# Patient Record
Sex: Male | Born: 1990 | Race: Black or African American | Hispanic: No | Marital: Single | Smoking: Never smoker
Health system: Southern US, Community
[De-identification: ages and names within clinical notes are randomized; demographics above are authoritative.]

## PROBLEM LIST (undated history)

## (undated) DIAGNOSIS — F988 Other specified behavioral and emotional disorders with onset usually occurring in childhood and adolescence: Secondary | ICD-10-CM

## (undated) DIAGNOSIS — F329 Major depressive disorder, single episode, unspecified: Secondary | ICD-10-CM

## (undated) DIAGNOSIS — F32A Depression, unspecified: Secondary | ICD-10-CM

## (undated) DIAGNOSIS — J302 Other seasonal allergic rhinitis: Secondary | ICD-10-CM

---

## 2013-10-01 ENCOUNTER — Encounter (HOSPITAL_COMMUNITY): Payer: Self-pay | Admitting: Emergency Medicine

## 2013-10-01 ENCOUNTER — Emergency Department (HOSPITAL_COMMUNITY)
Admission: EM | Admit: 2013-10-01 | Discharge: 2013-10-01 | Disposition: A | Discharge status: Home / Self Care | Payer: Managed Care, Other (non HMO) | Source: Home / Self Care | Attending: Family Medicine | Admitting: Family Medicine

## 2013-10-01 ENCOUNTER — Emergency Department (INDEPENDENT_AMBULATORY_CARE_PROVIDER_SITE_OTHER): Payer: Managed Care, Other (non HMO)

## 2013-10-01 DIAGNOSIS — Y9302 Activity, running: Secondary | ICD-10-CM

## 2013-10-01 DIAGNOSIS — S93609A Unspecified sprain of unspecified foot, initial encounter: Secondary | ICD-10-CM

## 2013-10-01 DIAGNOSIS — S93601A Unspecified sprain of right foot, initial encounter: Secondary | ICD-10-CM

## 2013-10-01 HISTORY — DX: Depression, unspecified: F32.A

## 2013-10-01 HISTORY — DX: Other specified behavioral and emotional disorders with onset usually occurring in childhood and adolescence: F98.8

## 2013-10-01 HISTORY — DX: Other seasonal allergic rhinitis: J30.2

## 2013-10-01 HISTORY — DX: Major depressive disorder, single episode, unspecified: F32.9

## 2013-10-01 NOTE — ED Notes (Signed)
Pt c/o right foot pain/plantar onset yest Reports he was running on a treadmill on Thursday Pain increases when bearing wt Alert w/no signs of acute distress.

## 2013-10-01 NOTE — ED Provider Notes (Signed)
CSN: [id number]     Arrival date & time 10/01/13  1706 History   First MD Initiated Contact with Patient 10/01/13 1724     Chief Complaint  Patient presents with  . Foot Pain   (Consider location/radiation/quality/duration/timing/severity/associated sxs/prior Treatment) Patient is a 23 y.o. male presenting with lower extremity pain. The history is provided by the patient.  Foot Pain This is a new problem. The current episode started 2 days ago (running on thurs, NKI, onset of pain on fri.). The problem has been gradually worsening. The symptoms are aggravated by walking.    Past Medical History  Diagnosis Date  . ADD (attention deficit disorder)   . Depression   . Seasonal allergies    History reviewed. No pertinent past surgical history. No family history on file. History  Substance Use Topics  . Smoking status: Never Smoker   . Smokeless tobacco: Not on file  . Alcohol Use: No    Review of Systems  Constitutional: Negative.   Gastrointestinal: Negative.   Genitourinary: Negative.   Musculoskeletal: Positive for gait problem. Negative for joint swelling.  Skin: Negative.     Allergies  Soy allergy  Home Medications   Prior to Admission medications   Not on File   BP 139/84  Pulse 66  Temp(Src) 97.9 F (36.6 C) (Oral)  Resp 14  SpO2 99% Physical Exam  Nursing note and vitals reviewed. Constitutional: He is oriented to person, place, and time. He appears well-developed and well-nourished. No distress.  Musculoskeletal: He exhibits tenderness.       Feet:  Neurological: He is alert and oriented to person, place, and time.  Skin: Skin is warm and dry.    ED Course  Procedures (including critical care time) Labs Review Labs Reviewed - No data to display  Imaging Review Dg Foot Complete Right  10/01/2013   CLINICAL DATA:  Lateral right foot pain for 2 days after running.  EXAM: RIGHT FOOT COMPLETE - 3+ VIEW  COMPARISON:  None.  FINDINGS: Imaged bones,  joints and soft tissues appear normal.  IMPRESSION: Negative exam.   Electronically Signed   By: Drusilla Kannerhomas  Dalessio M.D.   On: 10/01/2013 17:59    X-rays reviewed and report per radiologist.  MDM   1. Sprain of foot, right        Linna HoffJames D Azari Hasler, MD 10/01/13 [phone]

## 2013-10-01 NOTE — Discharge Instructions (Signed)
Wear support for comfort, use ice and advil for soreness and swelling, activity as tolerated.

## 2015-08-28 IMAGING — CR DG FOOT COMPLETE 3+V*R*
3 series · 3 of 3 positions shown · non-contrast
Comparison: None.

CLINICAL DATA: Lateral right foot pain for 2 days after running.

EXAM:
RIGHT FOOT COMPLETE - 3+ VIEW

[view not recorded (1 of 3)]
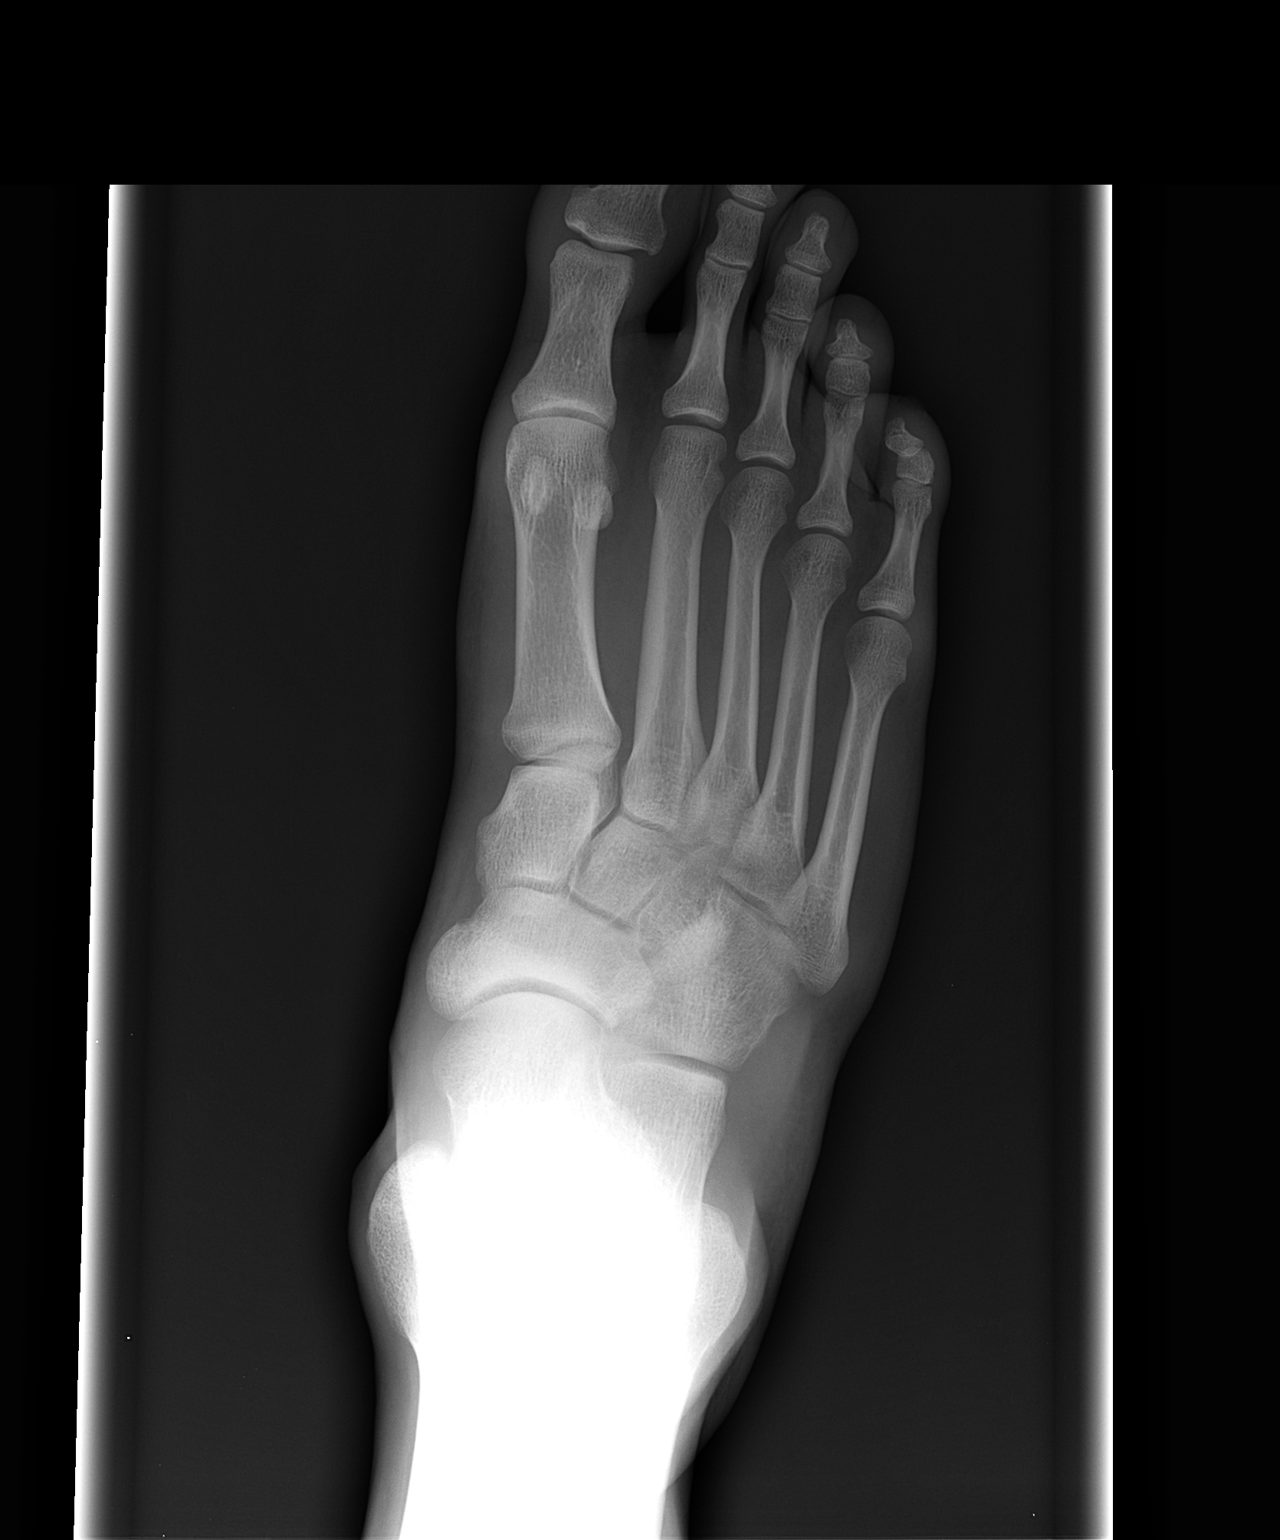

[view not recorded (2 of 3)]
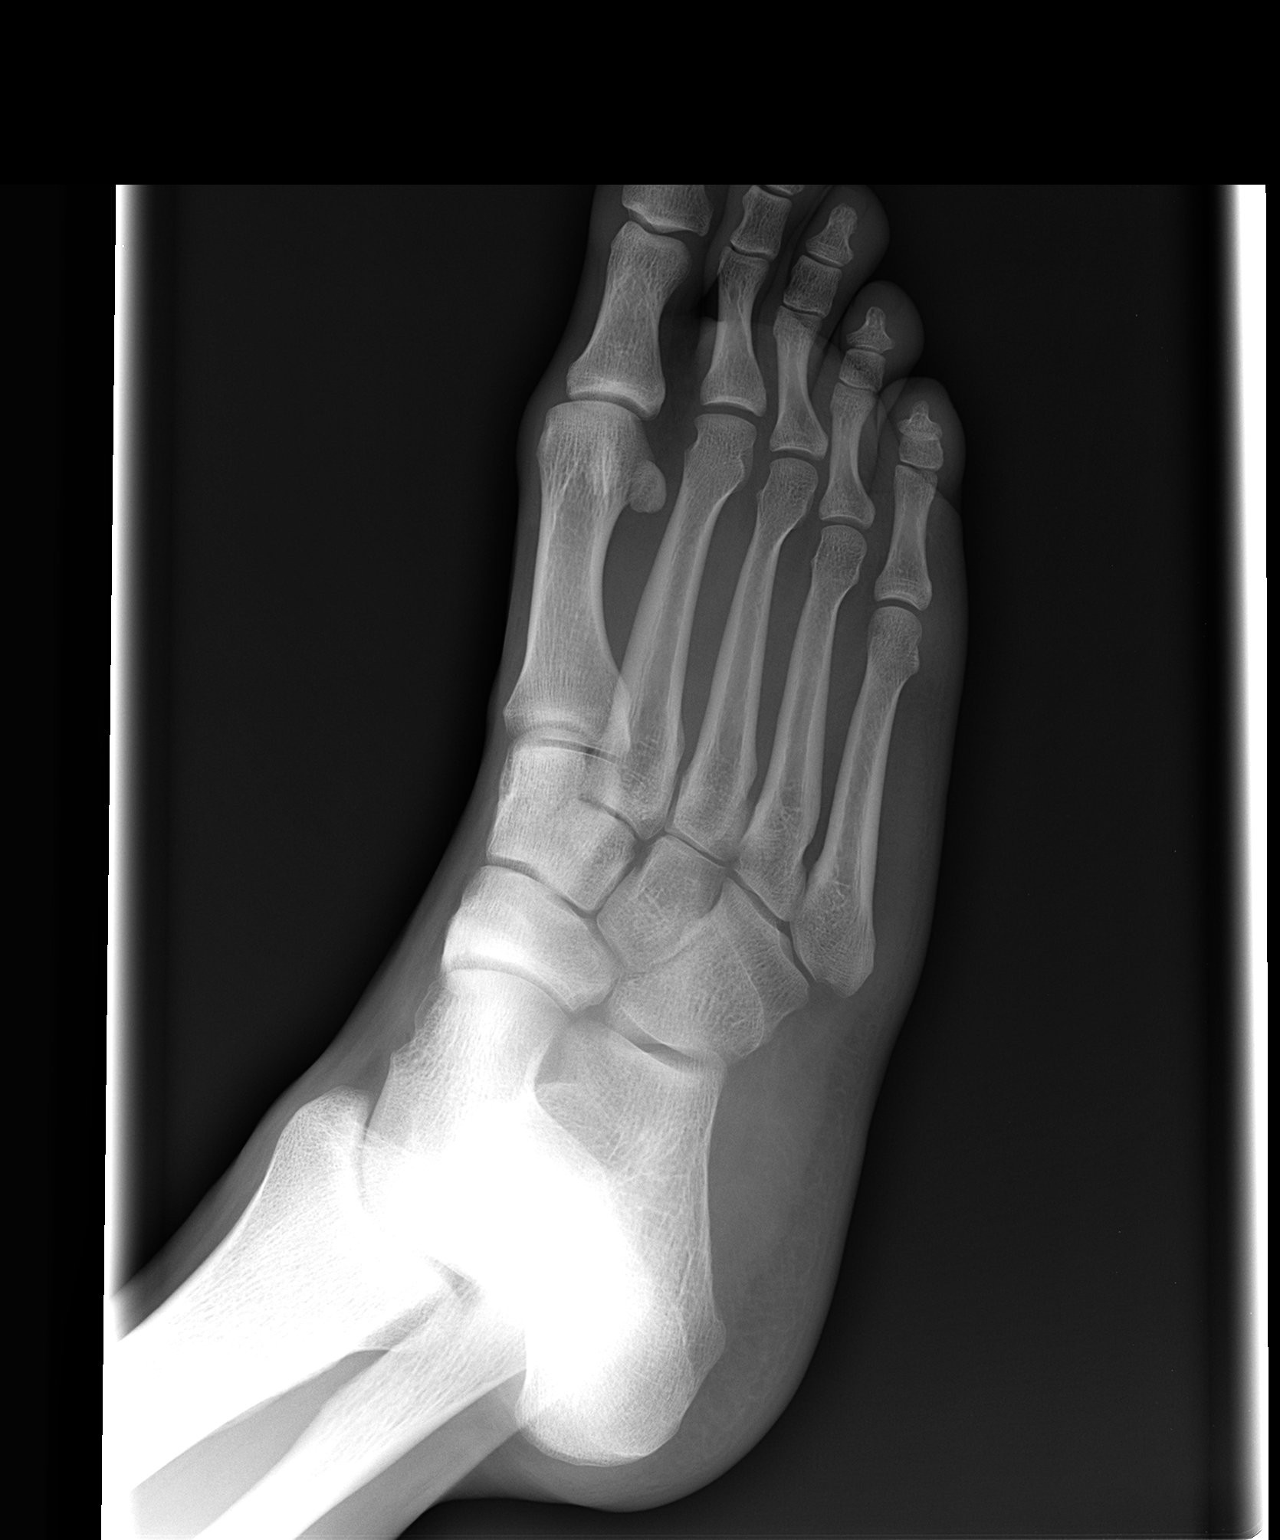

[view not recorded (3 of 3)]
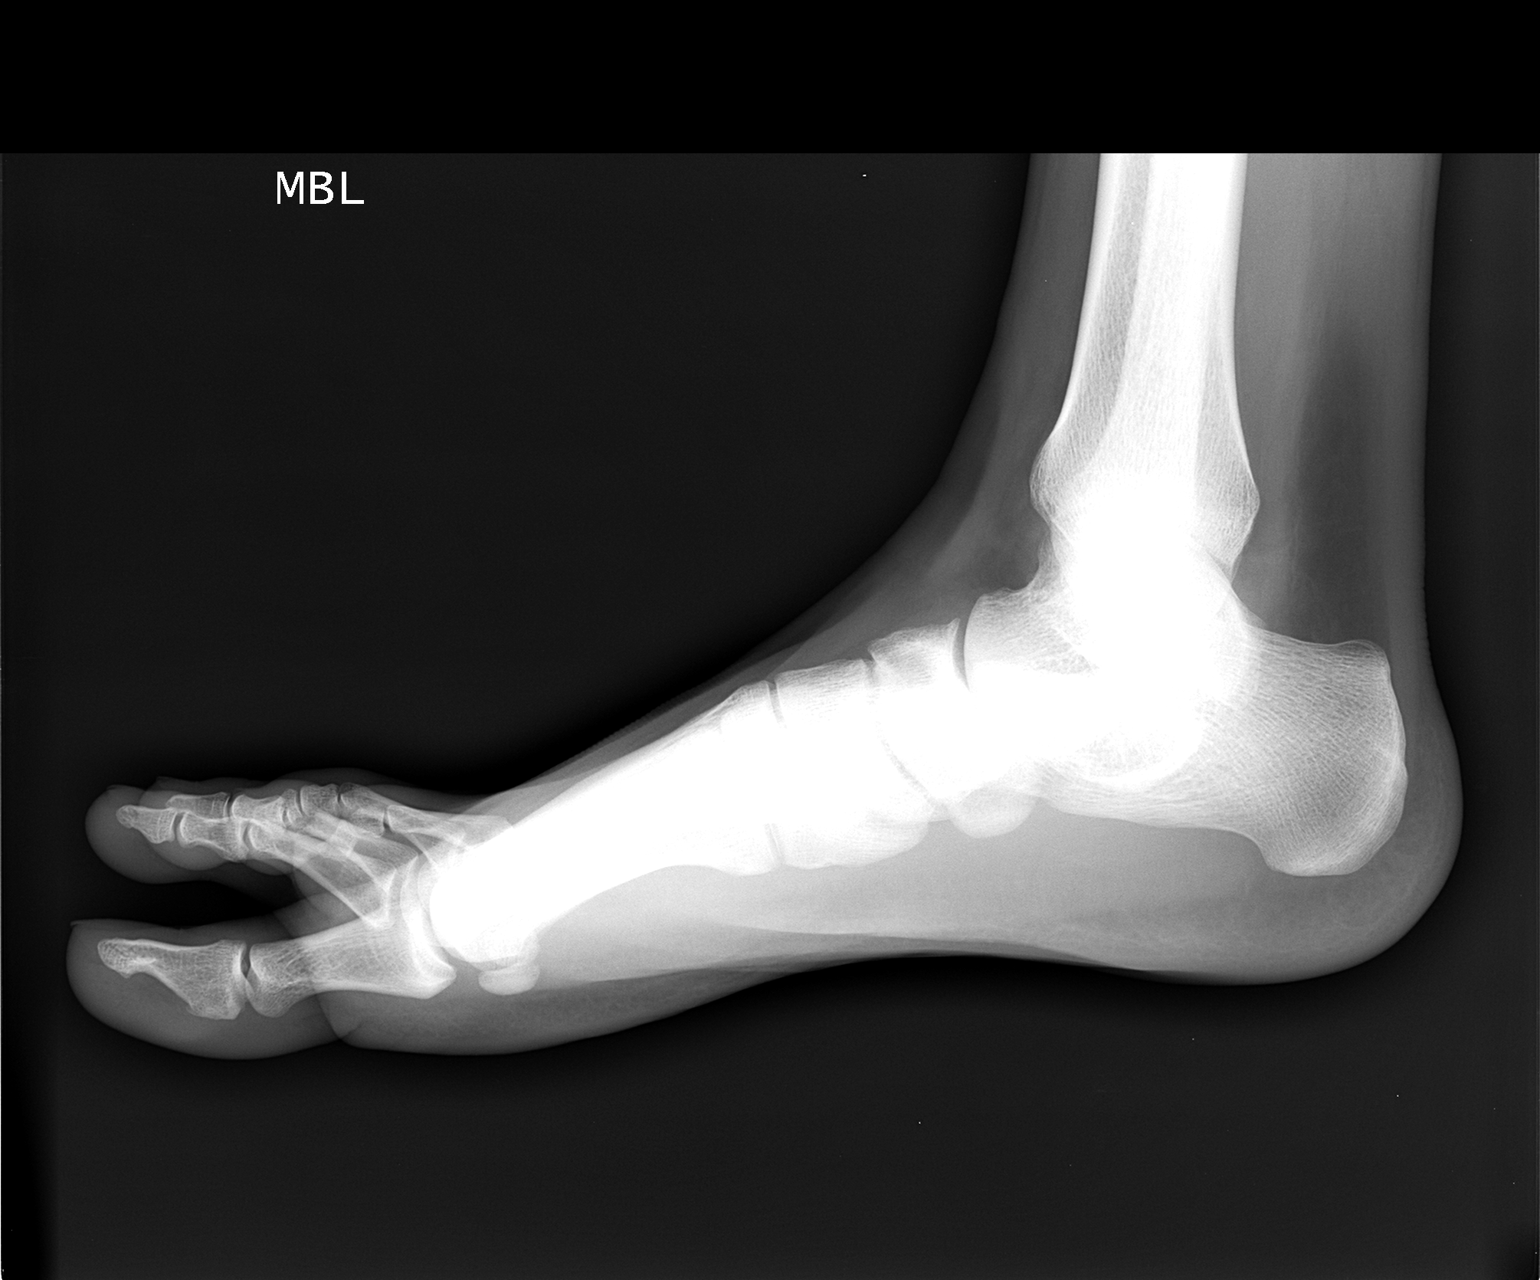

[3 of 3 positions shown; findings below may reference images not displayed]

FINDINGS: Imaged bones, joints and soft tissues appear normal.
IMPRESSION: Negative exam.
